# Patient Record
Sex: Female | Born: 1995 | Hispanic: Refuse to answer | Marital: Single | Smoking: Never smoker
Health system: Southern US, Community
[De-identification: ages and names within clinical notes are randomized; demographics above are authoritative.]

---

## 2016-05-20 ENCOUNTER — Ambulatory Visit
Admission: RE | Admit: 2016-05-20 | Discharge: 2016-05-20 | Disposition: A | Discharge status: Home / Self Care | Payer: Managed Care, Other (non HMO) | Source: Ambulatory Visit | Attending: Family Medicine | Admitting: Family Medicine

## 2016-05-20 ENCOUNTER — Encounter: Payer: Self-pay | Admitting: Family Medicine

## 2016-05-20 ENCOUNTER — Ambulatory Visit (INDEPENDENT_AMBULATORY_CARE_PROVIDER_SITE_OTHER): Payer: Managed Care, Other (non HMO) | Admitting: Family Medicine

## 2016-05-20 DIAGNOSIS — M79605 Pain in left leg: Secondary | ICD-10-CM

## 2016-05-21 NOTE — Progress Notes (Signed)
Patient presents today with symptoms of left lower leg pain. Patient states that she's had the symptoms for the last 2 weeks. She denies any trauma or injury to the area. She denies any previous history of stress reaction or fracture. She denies any restrictive behaviors in her diet. She denies any history of menstrual problems. She denies any significant weight gain or weight loss. She states that the pain is mostly with running/dancing and has recently noticed the pain with walking. She denies any swelling of the area, discoloration, paresthesias. She has no symptoms in her right leg. She denies any known history of Vitamin D Deficiency.  ROS: Negative except mentioned above.  Vitals as per Epic.  GENERAL: NAD RESP: CTA B CARD: RRR MSK: LLE - no obvious deformity, tenderness along the mid to distal fibula on palpation, full range of motion, mild overpronation with walking, positive hop tests, normal heel and toe walking, NV intact  NEURO: CN II-XII grossly intact   A/P: Left lower extremity pain - given patient's symptoms would recommend doing x-ray initially, would like patient to start wearing a walking boot, she is to follow-up with the trainer daily for interval change, no dancing or running at this time, cross training if tolerated, follow up with me next week, change at running she is if needed, if any further imaging needed will decide next week at follow-up. Would also check vitamin D level at next visit if symptoms are persistent/worse. Patient addresses understanding of plan.

## 2016-05-27 ENCOUNTER — Ambulatory Visit (INDEPENDENT_AMBULATORY_CARE_PROVIDER_SITE_OTHER): Payer: Managed Care, Other (non HMO) | Admitting: Family Medicine

## 2016-05-27 DIAGNOSIS — M79605 Pain in left leg: Secondary | ICD-10-CM

## 2016-05-27 NOTE — Progress Notes (Signed)
Patient presents today for follow-up regarding left lower extremity pain. Patient states for the past week she has been in the walking boot and has had decreased pain. X-rays did not show stress fracture. 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 She states that at times when she is out of the boot she doesn't have any significant pain. She has not tried to crosstrained during this last week.  ROS: Negative except mentioned above.  Vitals as per Epic. GENERAL: NAD MSK: Left lower extremity- no tenderness to palpation, full range of motion, negative hop test, NV intact NEURO: CN II-XII grossly intact    A/P: Left lower extremity stress reaction - x-rays were negative for stress fracture, patient is improving, I have asked that she start to crosstrained into, but the boot into a tennis shoe, she is to advance her activity back to dancing slowly. I will discuss this with her trainer. Follow-up next week if needed.

## 2017-11-30 IMAGING — CR DG TIBIA/FIBULA 2V*L*
1 series · 2 of 2 positions shown · non-contrast
Comparison: None.

CLINICAL DATA: Lower leg pain from dancing and working [DATE]
weeks. No injury.

EXAM:
LEFT TIBIA AND FIBULA - 2 VIEW

[Series 1: dg tibia/fibula left · 0.14mm/px · 2 of 2 slices shown]
[im 1/2]
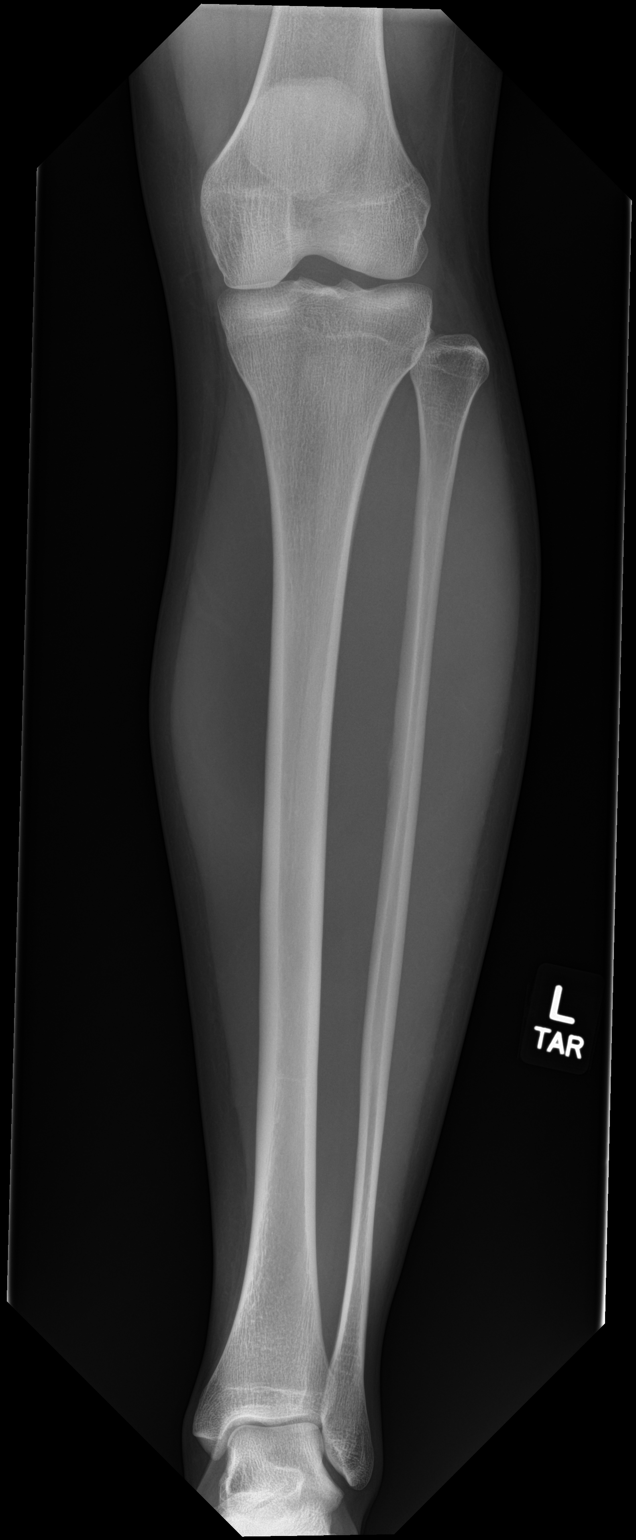
[im 2/2]
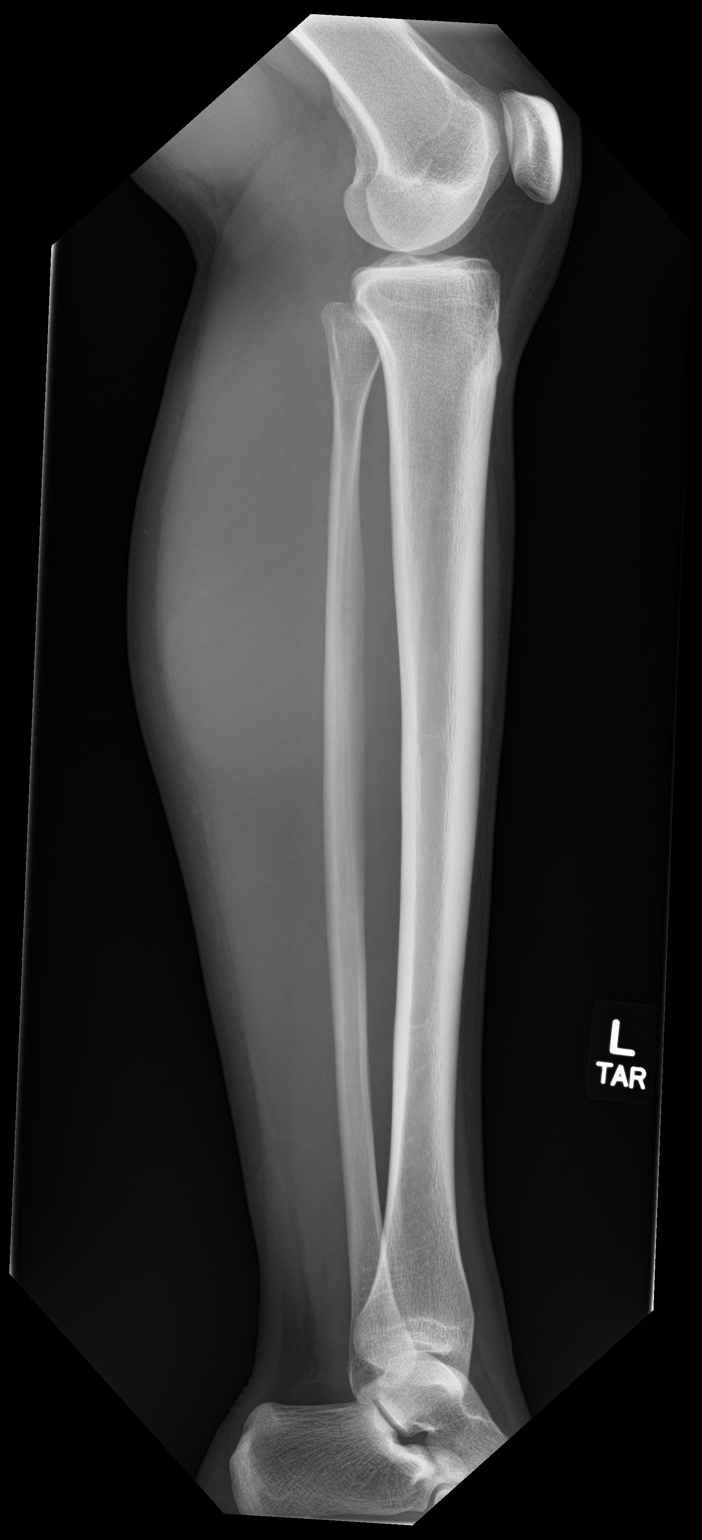

[2 of 2 positions shown; findings below may reference images not displayed]

FINDINGS: There is no evidence of fracture or other focal bone lesions. Soft
tissues are unremarkable.
IMPRESSION: Negative.
